# Patient Record
Sex: Male | Born: 1975 | Race: White | Hispanic: No | Marital: Married | Smoking: Never smoker
Health system: Southern US, Community
[De-identification: ages and names within clinical notes are randomized; demographics above are authoritative.]

## PROBLEM LIST (undated history)

## (undated) HISTORY — PX: OTHER SURGICAL HISTORY: SHX169

---

## 2016-05-30 ENCOUNTER — Other Ambulatory Visit: Payer: Self-pay | Admitting: Family Medicine

## 2016-05-30 ENCOUNTER — Ambulatory Visit
Admission: RE | Admit: 2016-05-30 | Discharge: 2016-05-30 | Disposition: A | Discharge status: Home / Self Care | Payer: 59 | Source: Ambulatory Visit | Attending: Family Medicine | Admitting: Family Medicine

## 2016-05-30 DIAGNOSIS — M545 Low back pain: Secondary | ICD-10-CM

## 2017-03-13 DIAGNOSIS — J029 Acute pharyngitis, unspecified: Secondary | ICD-10-CM | POA: Diagnosis not present

## 2018-04-09 DIAGNOSIS — M25562 Pain in left knee: Secondary | ICD-10-CM | POA: Diagnosis not present

## 2018-06-23 DIAGNOSIS — M25562 Pain in left knee: Secondary | ICD-10-CM | POA: Diagnosis not present

## 2018-06-25 DIAGNOSIS — D2371 Other benign neoplasm of skin of right lower limb, including hip: Secondary | ICD-10-CM | POA: Diagnosis not present

## 2018-06-25 DIAGNOSIS — Z1283 Encounter for screening for malignant neoplasm of skin: Secondary | ICD-10-CM | POA: Diagnosis not present

## 2018-06-25 DIAGNOSIS — D225 Melanocytic nevi of trunk: Secondary | ICD-10-CM | POA: Diagnosis not present

## 2018-06-25 DIAGNOSIS — D485 Neoplasm of uncertain behavior of skin: Secondary | ICD-10-CM | POA: Diagnosis not present

## 2018-06-25 DIAGNOSIS — D2261 Melanocytic nevi of right upper limb, including shoulder: Secondary | ICD-10-CM | POA: Diagnosis not present

## 2018-07-23 DIAGNOSIS — D2261 Melanocytic nevi of right upper limb, including shoulder: Secondary | ICD-10-CM | POA: Diagnosis not present

## 2018-08-06 DIAGNOSIS — L7682 Other postprocedural complications of skin and subcutaneous tissue: Secondary | ICD-10-CM | POA: Diagnosis not present

## 2018-08-06 DIAGNOSIS — D225 Melanocytic nevi of trunk: Secondary | ICD-10-CM | POA: Diagnosis not present

## 2018-10-19 ENCOUNTER — Other Ambulatory Visit: Payer: Self-pay | Admitting: Family Medicine

## 2018-10-19 DIAGNOSIS — M25562 Pain in left knee: Secondary | ICD-10-CM

## 2018-10-23 ENCOUNTER — Ambulatory Visit
Admission: RE | Admit: 2018-10-23 | Discharge: 2018-10-23 | Disposition: A | Discharge status: Home / Self Care | Payer: 59 | Source: Ambulatory Visit | Attending: Family Medicine | Admitting: Family Medicine

## 2018-10-23 DIAGNOSIS — M25562 Pain in left knee: Secondary | ICD-10-CM

## 2019-01-01 ENCOUNTER — Ambulatory Visit
Admission: RE | Admit: 2019-01-01 | Discharge: 2019-01-01 | Disposition: A | Discharge status: Home / Self Care | Payer: 59 | Source: Ambulatory Visit | Attending: Family Medicine | Admitting: Family Medicine

## 2019-01-01 ENCOUNTER — Other Ambulatory Visit: Payer: Self-pay | Admitting: Hepatology

## 2019-01-01 ENCOUNTER — Other Ambulatory Visit: Payer: Self-pay | Admitting: Family Medicine

## 2019-01-01 DIAGNOSIS — R079 Chest pain, unspecified: Secondary | ICD-10-CM | POA: Diagnosis not present

## 2019-01-01 DIAGNOSIS — R0602 Shortness of breath: Secondary | ICD-10-CM | POA: Diagnosis not present

## 2019-02-12 ENCOUNTER — Telehealth: Payer: Self-pay | Admitting: *Deleted

## 2019-02-12 NOTE — Telephone Encounter (Signed)
Left message to call back - need to discuss appt - new patient  Can do e-visit test will not been done until covid is completed or can reschedule to another time ,if so resched for may or June 2020.

## 2019-02-15 ENCOUNTER — Telehealth: Payer: Self-pay | Admitting: Cardiology

## 2019-02-15 NOTE — Telephone Encounter (Signed)
lmsg to call - April 14 appt needs to be evisit or r/s to June per [facility].

## 2019-02-18 ENCOUNTER — Ambulatory Visit: Payer: 59 | Admitting: Cardiology

## 2019-07-05 ENCOUNTER — Ambulatory Visit (INDEPENDENT_AMBULATORY_CARE_PROVIDER_SITE_OTHER): Payer: 59 | Admitting: Cardiology

## 2019-07-05 ENCOUNTER — Encounter: Payer: Self-pay | Admitting: Cardiology

## 2019-07-05 ENCOUNTER — Other Ambulatory Visit: Payer: Self-pay

## 2019-07-05 ENCOUNTER — Encounter

## 2019-07-05 VITALS — BP 98/62 | HR 58 | Temp 97.6°F | Ht 68.0 in | Wt 160.0 lb

## 2019-07-05 DIAGNOSIS — R0602 Shortness of breath: Secondary | ICD-10-CM

## 2019-07-05 NOTE — Progress Notes (Signed)
PCP: Patient, No Pcp Per  Clinic Note: Chief Complaint  Patient presents with  . New Patient (Initial Visit)    Shortness of breath for months    HPI: Jimmy Rosario is a otherwise healthy  43 y.o. male who is being seen today for the evaluation of shortness of breath at the request of Lois Huxley, [place].  Jimmy Rosario was referred actually based on visits earlier this year in February with his PCP for noting dyspnea/difficulty with catching breath.  Recent Hospitalizations: .  None  Studies Personally Reviewed - (if available, images/films reviewed: From Epic Chart or Care Everywhere)  None  Interval History: "Jimmy Rosario" presents here today for a delayed cardiology consultation visit based on symptoms that began back in January where he states that starting in January and February when it be was the worst, now still present as well, he was been having episodes where he notes that she is hard catching his breath at times.  It usually occurs when he is at rest or not very active.  Can happen anytime during the day, but is not associated with exertion.  But he actually says that he has sensation that he just cannot take a full breath in and maybe has to yawn to catch his breath.  He says sometimes it will happen at night when he lays down, but usually it is not associated with PND or orthopnea symptoms.  He does note that the symptoms are not present with exertion, he is able to exercise routinely without any major issues.  No chest pain or pressure with rest or exertion.  No exertional dyspnea.  Remainder of cardiovascular review of symptoms: No  edema.  No palpitations, lightheadedness, dizziness, weakness or syncope/near syncope. No TIA/amaurosis fugax symptoms. No claudication.  ROS: A comprehensive was performed. Review of Systems  Constitutional: Negative for malaise/fatigue.  HENT: Negative for congestion and nosebleeds.   Respiratory: Negative for shortness of breath.    Cardiovascular:       Per HPI  Gastrointestinal: Negative for blood in stool, heartburn, melena, nausea and vomiting.  Genitourinary: Negative for hematuria.  Musculoskeletal: Negative for joint pain.  Neurological: Negative for dizziness.  Psychiatric/Behavioral: The patient is not nervous/anxious and does not have insomnia.   All other systems reviewed and are negative.  The patient does not have symptoms concerning for COVID-19 infection (fever, chills, cough, or new shortness of breath).  The patient is practicing social distancing.   COVID-19 Education: The signs and symptoms of COVID-19 were discussed with the patient and how to seek care for testing (follow up with PCP or arrange E-visit).   The importance of social distancing was discussed today.    I have reviewed and (if needed) personally updated the patient's problem list, medications, allergies, past medical and surgical history, social and family history.   History reviewed. No pertinent past medical history.  Past Surgical History:  Procedure Laterality Date  . none      No outpatient medications have been marked as taking for the 07/05/19 encounter (Office Visit) with Leonie Man, MD.    No Known Allergies  Social History   Tobacco Use  . Smoking status: Never Smoker  . Smokeless tobacco: Never Used  Substance Use Topics  . Alcohol use: Not on file    Comment: Maybe has a beer or glass of wine once or twice a month  . Drug use: Never   Social History   Social History Narrative   He  lives with his wife and 2 daughters.   Prior to COVID-19, he enjoyed playing soccer with friends   He does high intensity (HI IT -combination cardio and weights).  Exercises 5 days a week for about half an hour    family history includes Healthy in his brother; Heart disease in his maternal grandfather; Hyperlipidemia in his father; Kidney disease in his maternal grandmother and mother.  Wt Readings from Last 3  Encounters:  07/05/19 160 lb (72.6 kg)    PHYSICAL EXAM BP 98/62 (BP Location: Left Arm, Patient Position: Sitting, Cuff Size: Normal)   Pulse (!) 58   Temp 97.6 F (36.4 C)   Ht 5\' 8"  (1.727 m)   Wt 160 lb (72.6 kg)   BMI 24.33 kg/m  Physical Exam  Constitutional: He is oriented to person, place, and time. He appears well-developed and well-nourished. No distress.  Healthy gentleman.  Well-groomed.  HENT:  Head: Normocephalic and atraumatic.  Mouth/Throat: Oropharynx is clear and moist.  Eyes: Pupils are equal, round, and reactive to light. Conjunctivae and EOM are normal. No scleral icterus.  Neck: Normal range of motion. Neck supple. No hepatojugular reflux and no JVD present. Carotid bruit is not present. No tracheal deviation present. No thyromegaly present.  Cardiovascular: Normal rate, regular rhythm, normal heart sounds, intact distal pulses and normal pulses.  No extrasystoles are present. PMI is not displaced. Exam reveals no gallop and no friction rub.  No murmur heard. Pulmonary/Chest: Effort normal and breath sounds normal. No respiratory distress. He has no wheezes. He has no rales.  Abdominal: Soft. Bowel sounds are normal. He exhibits no distension. There is no abdominal tenderness. There is no rebound.  Musculoskeletal: Normal range of motion.        General: No edema.  Neurological: He is alert and oriented to person, place, and time. No cranial nerve deficit.  Skin: Skin is warm and dry. No rash noted. No erythema.  Psychiatric: He has a normal mood and affect. His behavior is normal. Judgment and thought content normal.  Vitals reviewed.    Adult ECG Report  Rate: 58 ;  Rhythm: sinus bradycardia, sinus arrhythmia and Otherwise normal axis, intervals and durations.;   Narrative Interpretation: Normal EKG   Other studies Reviewed: Additional studies/ records that were reviewed today include:  Recent Labs: None currently available  ASSESSMENT / PLAN:  Problem List Items Addressed This Visit    Shortness of breath - Primary    Very unusual description of his symptoms.  Not classic exertional dyspnea.  Is more of just difficulty catching his breath.  He does indicate that he is has some issues with nasal septal misalignment, and that could probably wait what it is.  He is not noticing any wheezing or coughing.  No symptoms of heart failure such as PND orthopnea.  It does not sound like it is ischemic because he is able to do his exercise without any symptoms.  I am not exactly sure what the best way to evaluate this is, but I think that we can exclude any structural abnormalities with a 2D echocardiogram.  I also think that is probably not unreasonable to check PFTs (he is a little bit reluctant to do that because of the need to do cover testing).  If the echocardiogram is normal I think we can pretty much exclude a cardiac etiology for dyspnea. If the PFTs are abnormal, I would suggest that he is referred to pulmonary medicine.  I will see him  back if his echo is abnormal, but otherwise he can return back to his PCP.      Relevant Orders   EKG 12-Lead (Completed)   Pulmonary Function Test   ECHOCARDIOGRAM COMPLETE      I spent a total of 25 minutes with the patient and chart review. >  50% of the time was spent in direct patient consultation.   Current medicines are reviewed at length with the patient today.  (+/- concerns) none The following changes have been made:  None  Patient Instructions  Medication Instructions:   If you need a refill on your cardiac medications before your next appointment, please call your pharmacy.   Lab work:  If you have labs (blood work) drawn today and your tests are completely normal, you will receive your results only by: Marland Kitchen MyChart Message (if you have MyChart) OR . A paper copy in the mail If you have any lab test that is abnormal or we need to change your treatment, we will call you to review the  results.  Testing/Procedures: WILL BE SCHEDULE AT  [place] Your physician has requested that you have an echocardiogram. Echocardiography is a painless test that uses sound waves to create images of your heart. It provides your doctor with information about the size and shape of your heart and how well your heart's chambers and valves are working. This procedure takes approximately one hour. There are no restrictions for this procedure.  AND WILL BE SCHEDULE AT [place] TO HAVE A COVID TEST 3 DAYS PRIOR TO THE  TEST IS  SCHEDULE -( 3 DAYS SELF ISOLATE)Your physician has recommended that you have a pulmonary function test. Pulmonary Function Tests are a group of tests that measure how well air moves in and out of your lungs.     Follow-Up: At [facility], you and your health needs are our priority.  As part of our continuing mission to provide you with exceptional heart care, we have created designated Provider Care Teams.  These Care Teams include your primary Cardiologist (physician) and Advanced Practice Providers (APPs -  Physician Assistants and Nurse Practitioners) who all work together to provide you with the care you need, when you need it. You will need a follow up appointment ON AN AS NEEDED BASIS IF ECHO IS NORMAL   Any Other Special Instructions Will Be Listed Below.    Studies Ordered:   Orders Placed This Encounter  Procedures  . EKG 12-Lead  . ECHOCARDIOGRAM COMPLETE  . Pulmonary Function Test      Glenetta Hew, M.D., M.S. Interventional Cardiologist   Pager # [phone] Phone # [phone] [address] [place], [place] [address]   Thank you for choosing Heartcare at [facility]!!

## 2019-07-05 NOTE — Patient Instructions (Addendum)
Medication Instructions:   If you need a refill on your cardiac medications before your next appointment, please call your pharmacy.   Lab work:  If you have labs (blood work) drawn today and your tests are completely normal, you will receive your results only by: Marland Kitchen MyChart Message (if you have MyChart) OR . A paper copy in the mail If you have any lab test that is abnormal or we need to change your treatment, we will call you to review the results.  Testing/Procedures: WILL BE SCHEDULE AT  [place] Your physician has requested that you have an echocardiogram. Echocardiography is a painless test that uses sound waves to create images of your heart. It provides your doctor with information about the size and shape of your heart and how well your heart's chambers and valves are working. This procedure takes approximately one hour. There are no restrictions for this procedure.  AND WILL BE SCHEDULE AT [place] TO HAVE A COVID TEST 3 DAYS PRIOR TO THE  TEST IS  SCHEDULE -( 3 DAYS SELF ISOLATE)Your physician has recommended that you have a pulmonary function test. Pulmonary Function Tests are a group of tests that measure how well air moves in and out of your lungs.     Follow-Up: At [facility], you and your health needs are our priority.  As part of our continuing mission to provide you with exceptional heart care, we have created designated Provider Care Teams.  These Care Teams include your primary Cardiologist (physician) and Advanced Practice Providers (APPs -  Physician Assistants and Nurse Practitioners) who all work together to provide you with the care you need, when you need it. You will need a follow up appointment ON AN AS NEEDED BASIS IF ECHO IS NORMAL   Any Other Special Instructions Will Be Listed Below.

## 2019-07-06 ENCOUNTER — Encounter: Payer: Self-pay | Admitting: Cardiology

## 2019-07-06 NOTE — Assessment & Plan Note (Signed)
Very unusual description of his symptoms.  Not classic exertional dyspnea.  Is more of just difficulty catching his breath.  He does indicate that he is has some issues with nasal septal misalignment, and that could probably wait what it is.  He is not noticing any wheezing or coughing.  No symptoms of heart failure such as PND orthopnea.  It does not sound like it is ischemic because he is able to do his exercise without any symptoms.  I am not exactly sure what the best way to evaluate this is, but I think that we can exclude any structural abnormalities with a 2D echocardiogram.  I also think that is probably not unreasonable to check PFTs (he is a little bit reluctant to do that because of the need to do cover testing).  If the echocardiogram is normal I think we can pretty much exclude a cardiac etiology for dyspnea. If the PFTs are abnormal, I would suggest that he is referred to pulmonary medicine.  I will see him back if his echo is abnormal, but otherwise he can return back to his PCP.

## 2019-07-13 ENCOUNTER — Other Ambulatory Visit: Payer: Self-pay

## 2019-07-13 ENCOUNTER — Ambulatory Visit (HOSPITAL_COMMUNITY): Payer: 59 | Attending: Cardiovascular Disease

## 2019-07-13 DIAGNOSIS — R0602 Shortness of breath: Secondary | ICD-10-CM | POA: Diagnosis present

## 2020-10-05 IMAGING — CR DG CHEST 2V
2 series · 2 of 2 positions shown · non-contrast
Comparison: None.

CLINICAL DATA: Short of breath for 3 weeks

EXAM:
CHEST - 2 VIEW

[w chest pa]
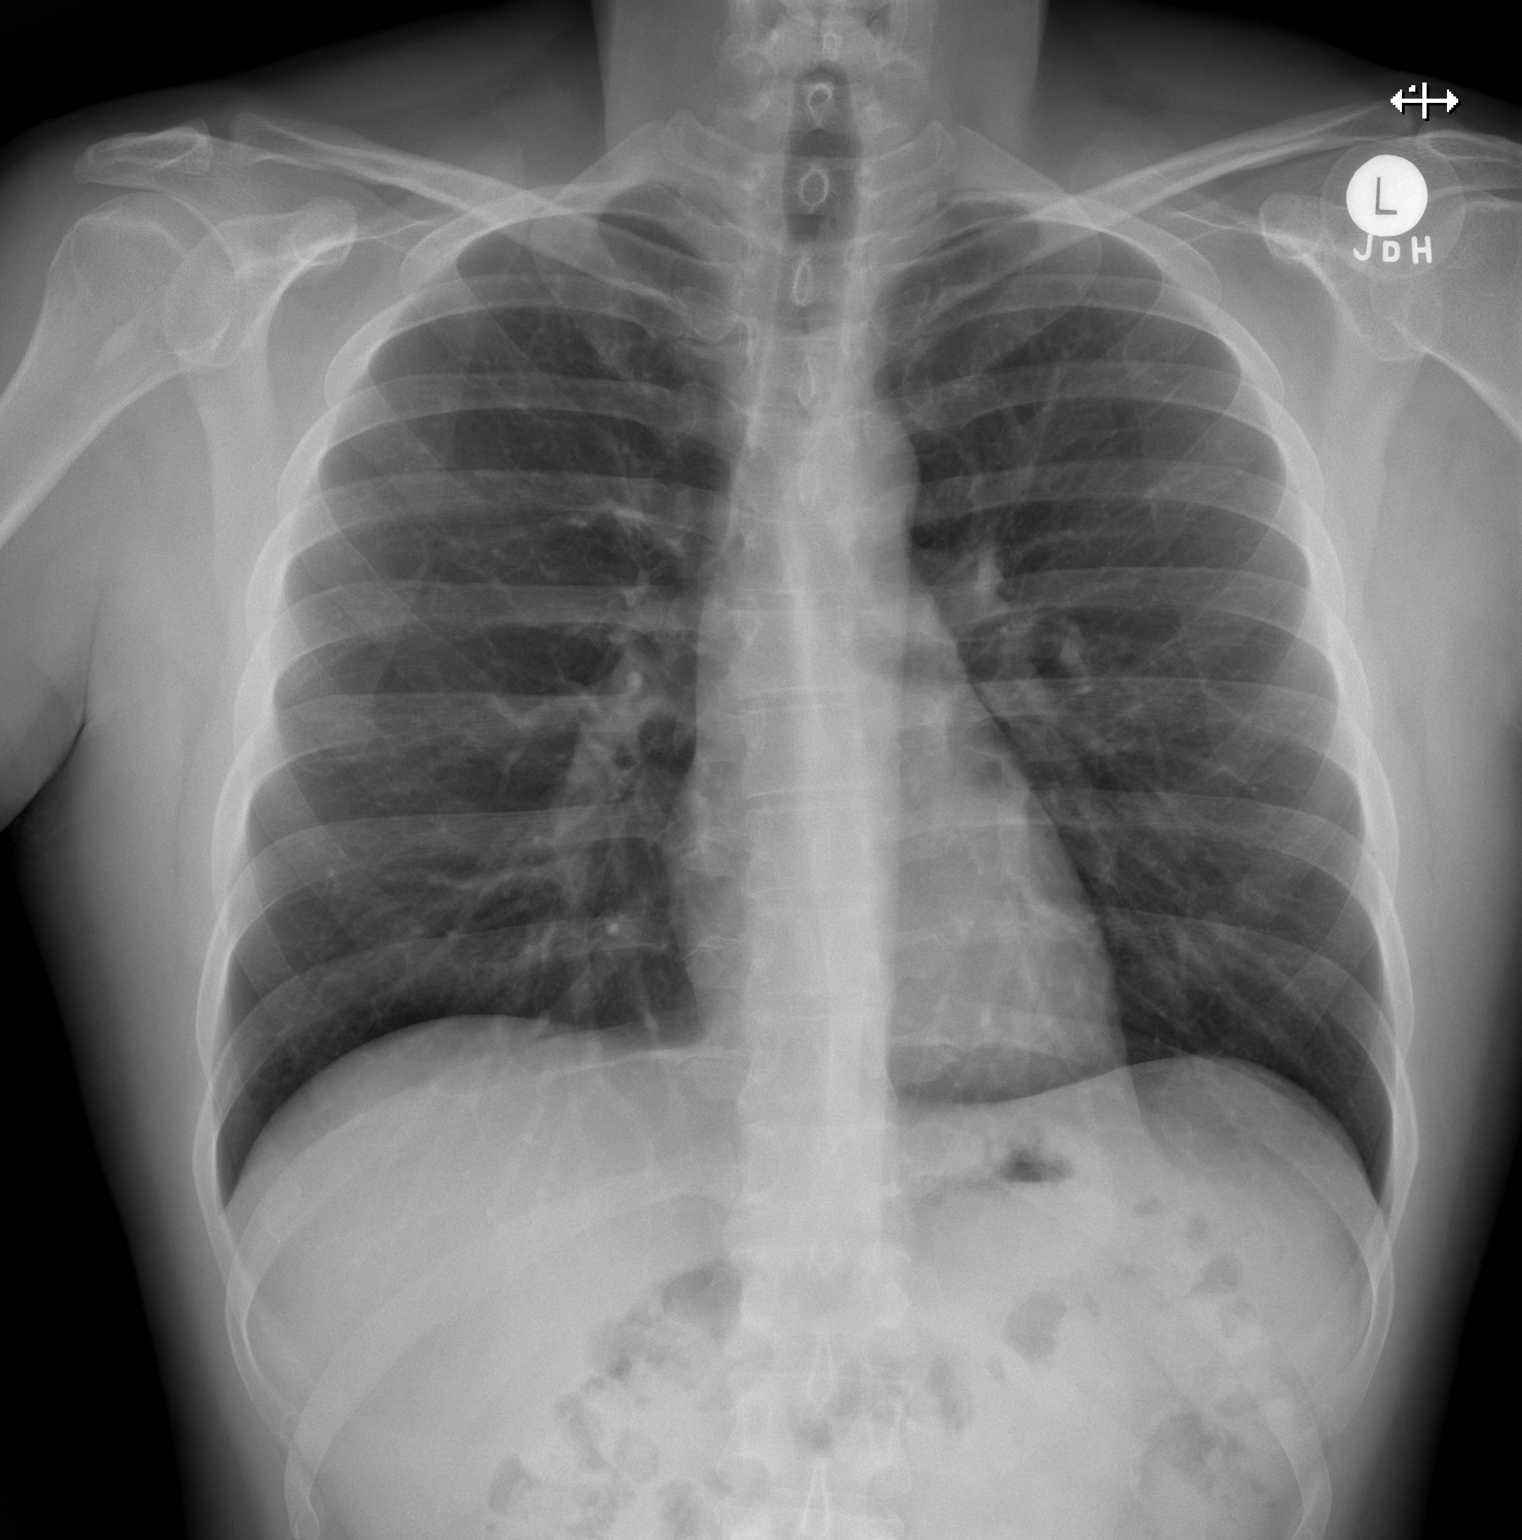

[w chest lat]
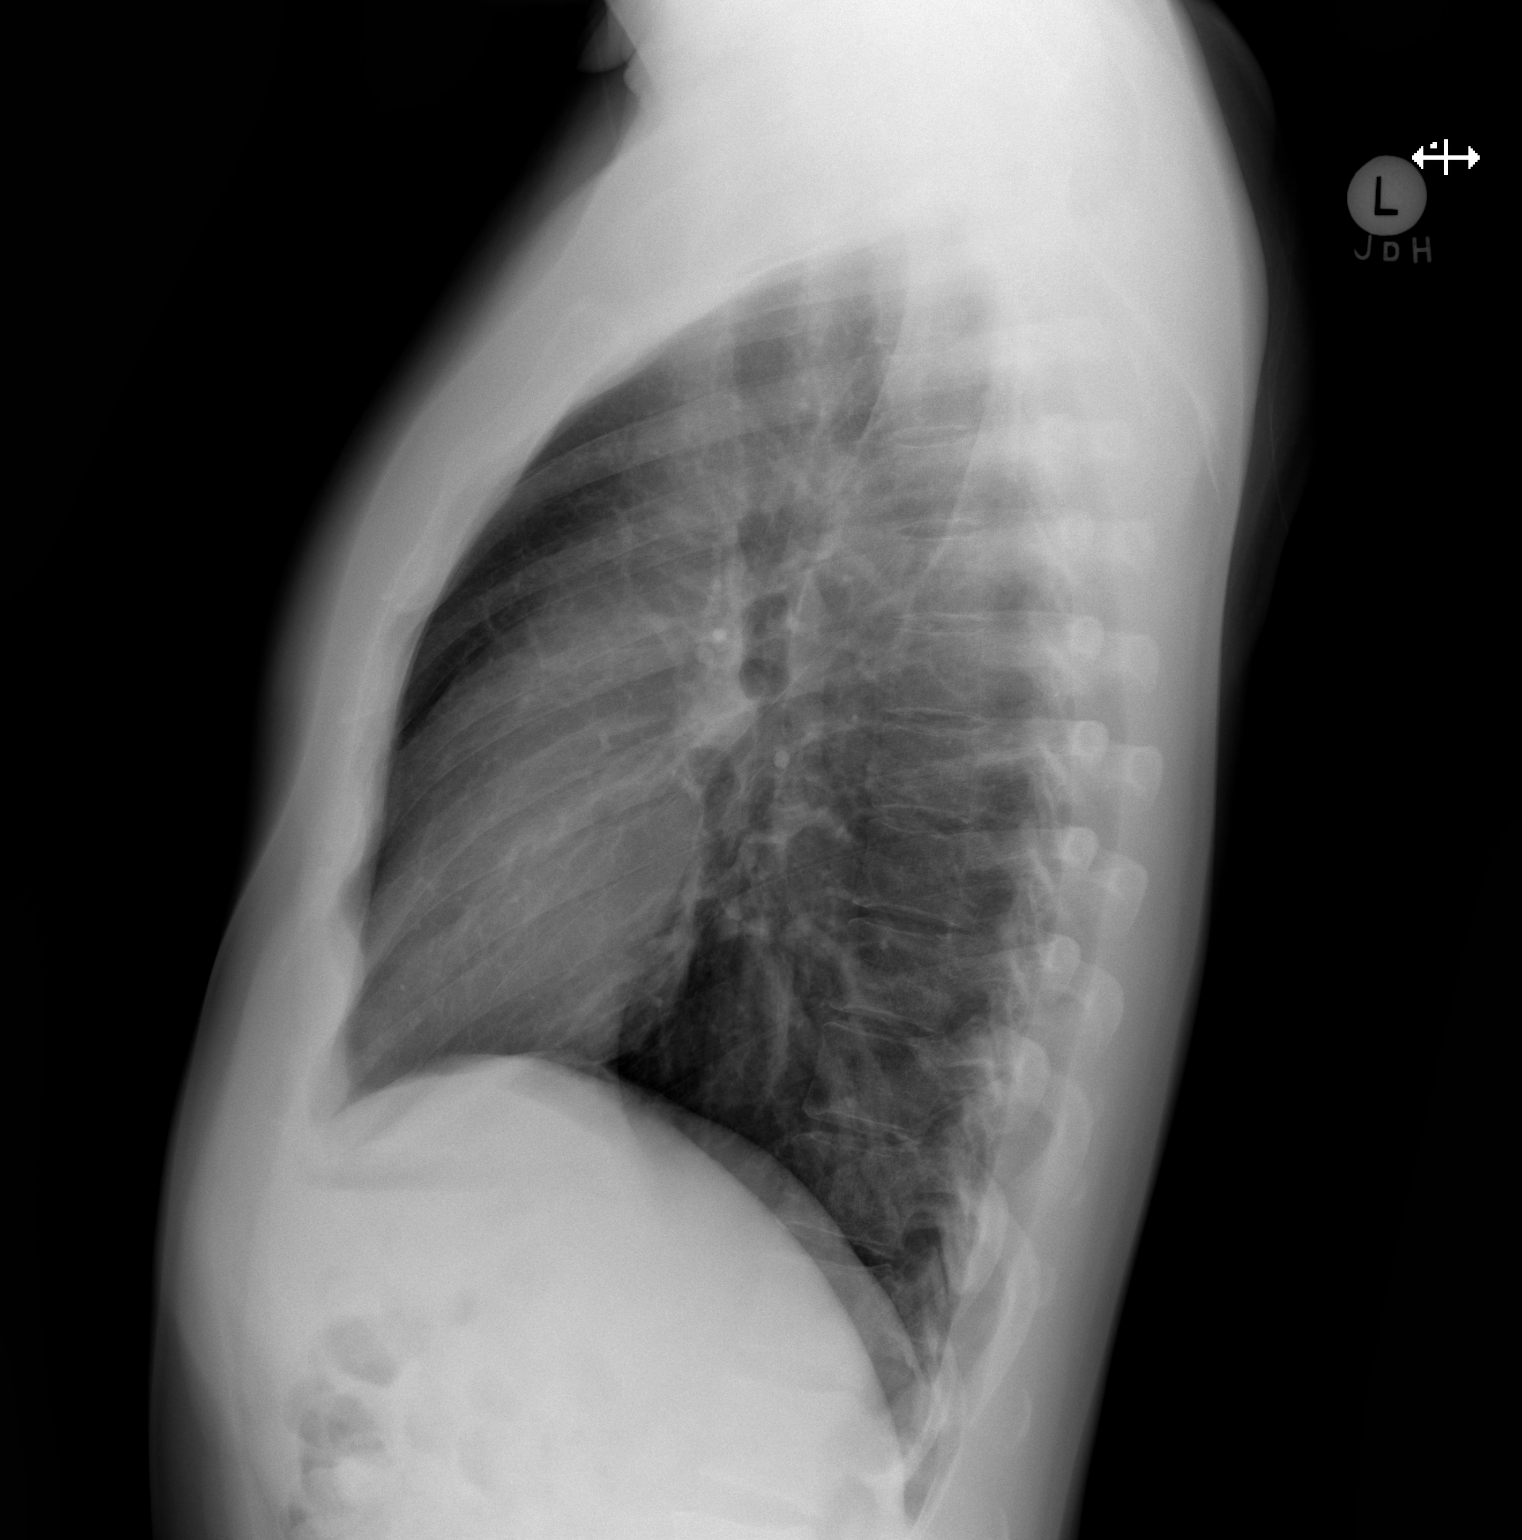

[2 of 2 positions shown; findings below may reference images not displayed]

FINDINGS: Normal heart size. Lungs clear. No pneumothorax. No pleural
effusion.
IMPRESSION: No active cardiopulmonary disease.
